# Patient Record
Sex: Male | Born: 1960 | Race: Black or African American | Hispanic: No | Marital: Single | Smoking: Current every day smoker
Health system: Southern US, Community
[De-identification: ages and names within clinical notes are randomized; demographics above are authoritative.]

## PROBLEM LIST (undated history)

## (undated) DIAGNOSIS — T7840XA Allergy, unspecified, initial encounter: Secondary | ICD-10-CM

## (undated) DIAGNOSIS — I10 Essential (primary) hypertension: Secondary | ICD-10-CM

## (undated) HISTORY — PX: TONSILLECTOMY: SHX5217

## (undated) HISTORY — PX: NOSE SURGERY: SHX723

## (undated) HISTORY — DX: Essential (primary) hypertension: I10

## (undated) HISTORY — DX: Allergy, unspecified, initial encounter: T78.40XA

---

## 1998-03-19 ENCOUNTER — Emergency Department (HOSPITAL_COMMUNITY): Admission: EM | Admit: 1998-03-19 | Discharge: 1998-03-19 | Payer: Self-pay | Admitting: *Deleted

## 2002-01-06 ENCOUNTER — Encounter: Payer: Self-pay | Admitting: Emergency Medicine

## 2002-01-06 ENCOUNTER — Emergency Department (HOSPITAL_COMMUNITY): Admission: EM | Admit: 2002-01-06 | Discharge: 2002-01-06 | Payer: Self-pay

## 2005-05-02 ENCOUNTER — Ambulatory Visit (HOSPITAL_COMMUNITY): Admission: RE | Admit: 2005-05-02 | Discharge: 2005-05-02 | Payer: Self-pay | Admitting: Gastroenterology

## 2005-05-02 ENCOUNTER — Encounter (INDEPENDENT_AMBULATORY_CARE_PROVIDER_SITE_OTHER): Payer: Self-pay | Admitting: *Deleted

## 2008-04-15 ENCOUNTER — Emergency Department (HOSPITAL_COMMUNITY): Admission: EM | Admit: 2008-04-15 | Discharge: 2008-04-16 | Payer: Self-pay | Admitting: Emergency Medicine

## 2010-01-23 ENCOUNTER — Emergency Department (HOSPITAL_COMMUNITY): Admission: EM | Admit: 2010-01-23 | Discharge: 2010-01-23 | Payer: Self-pay | Admitting: Family Medicine

## 2010-02-01 ENCOUNTER — Emergency Department (HOSPITAL_COMMUNITY): Admission: EM | Admit: 2010-02-01 | Discharge: 2010-02-01 | Payer: Self-pay | Admitting: Emergency Medicine

## 2010-02-04 ENCOUNTER — Encounter: Admission: RE | Admit: 2010-02-04 | Discharge: 2010-02-04 | Payer: Self-pay | Admitting: Internal Medicine

## 2010-12-13 LAB — URINE CULTURE
Colony Count: NO GROWTH
Culture: NO GROWTH

## 2010-12-13 LAB — POCT I-STAT, CHEM 8
BUN: 8 mg/dL (ref 6–23)
Calcium, Ion: 1.15 mmol/L (ref 1.12–1.32)
Chloride: 102 mEq/L (ref 96–112)
Creatinine, Ser: 1 mg/dL (ref 0.4–1.5)
Glucose, Bld: 123 mg/dL — ABNORMAL HIGH (ref 70–99)
HCT: 49 % (ref 39.0–52.0)
Hemoglobin: 16.7 g/dL (ref 13.0–17.0)
Potassium: 4 mEq/L (ref 3.5–5.1)
Sodium: 139 mEq/L (ref 135–145)
TCO2: 29 mmol/L (ref 0–100)

## 2010-12-13 LAB — GC/CHLAMYDIA PROBE AMP, GENITAL
Chlamydia, DNA Probe: NEGATIVE
GC Probe Amp, Genital: NEGATIVE

## 2010-12-13 LAB — POCT URINALYSIS DIP (DEVICE)
Bilirubin Urine: NEGATIVE
Glucose, UA: NEGATIVE mg/dL
Ketones, ur: NEGATIVE mg/dL
Nitrite: NEGATIVE
Protein, ur: NEGATIVE mg/dL
Specific Gravity, Urine: 1.01 (ref 1.005–1.030)
Urobilinogen, UA: 0.2 mg/dL (ref 0.0–1.0)
pH: 5.5 (ref 5.0–8.0)

## 2011-10-28 IMAGING — CR DG ABDOMEN 1V
1 series · 1 of 1 positions shown · non-contrast
Comparison: None.

CLINICAL DATA: Penile pain/hematuria

ABDOMEN - 1 VIEW

[t abdomen supine]
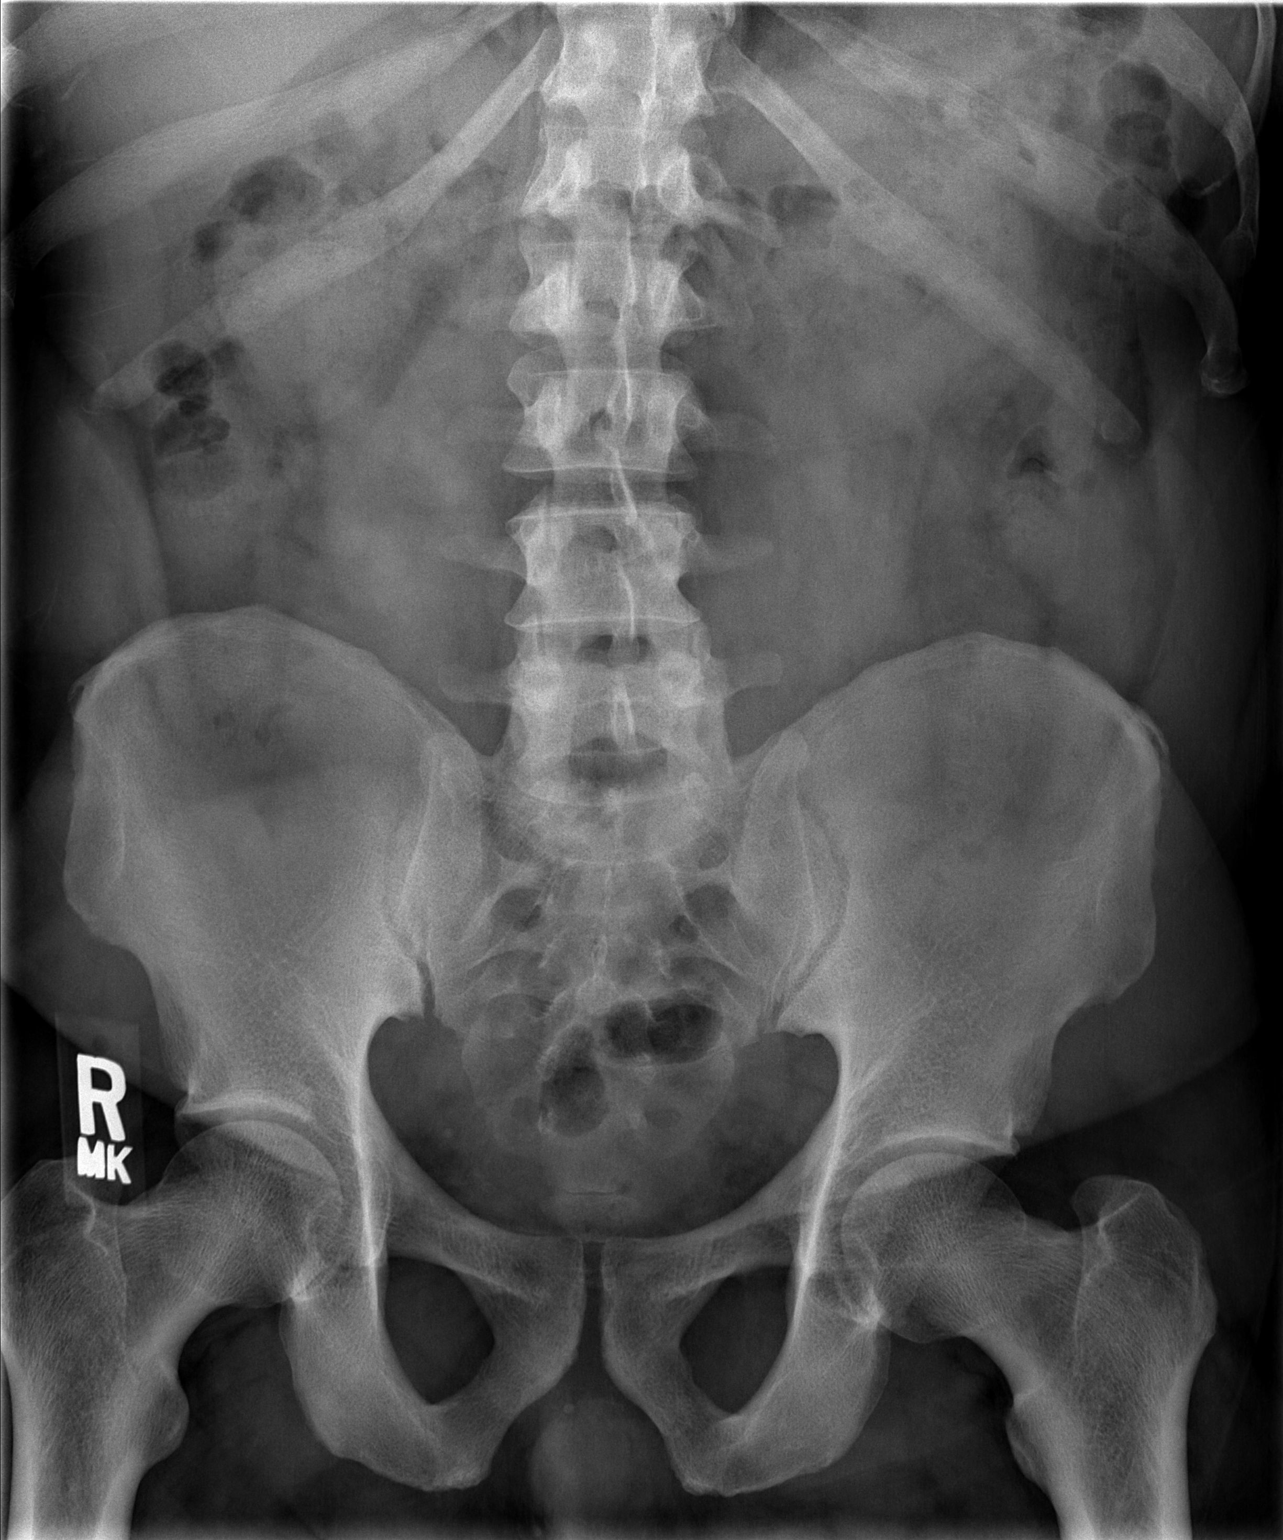

[1 of 1 positions shown; findings below may reference images not displayed]

FINDINGS: There are no visible renal or ureteral calculi.  Psoas margins
intact.  No pathological calcifications.  Osseous structures
intact.
IMPRESSION: No urinary tract calcifications or other pathological findings by
plain film analysis.i

## 2014-07-08 ENCOUNTER — Telehealth: Payer: Self-pay

## 2014-07-08 NOTE — Telephone Encounter (Signed)
Rec'd from [facility] forward 13 pages to Historical Provider

## 2014-07-15 ENCOUNTER — Encounter: Payer: Self-pay | Admitting: Gastroenterology

## 2014-08-04 ENCOUNTER — Ambulatory Visit (AMBULATORY_SURGERY_CENTER): Payer: BC Managed Care – PPO | Admitting: *Deleted

## 2014-08-04 VITALS — Ht 64.0 in | Wt 226.0 lb

## 2014-08-04 DIAGNOSIS — Z86010 Personal history of colonic polyps: Secondary | ICD-10-CM

## 2014-08-04 MED ORDER — NA SULFATE-K SULFATE-MG SULF 17.5-3.13-1.6 GM/177ML PO SOLN: 1.0000 | Freq: Once | ORAL | Status: DC

## 2014-08-04 NOTE — Progress Notes (Signed)
No egg or soy allergy. No anesthesia problems.  No home O2.  No diet meds.  

## 2014-08-10 ENCOUNTER — Encounter: Payer: Self-pay | Admitting: Internal Medicine

## 2014-09-01 ENCOUNTER — Encounter: Payer: Self-pay | Admitting: Gastroenterology

## 2014-09-01 ENCOUNTER — Ambulatory Visit (AMBULATORY_SURGERY_CENTER): Payer: BC Managed Care – PPO | Admitting: Gastroenterology

## 2014-09-01 VITALS — BP 140/89 | HR 82 | Temp 97.9°F | Resp 18 | Ht 64.0 in | Wt 226.0 lb

## 2014-09-01 DIAGNOSIS — K5730 Diverticulosis of large intestine without perforation or abscess without bleeding: Secondary | ICD-10-CM

## 2014-09-01 DIAGNOSIS — D122 Benign neoplasm of ascending colon: Secondary | ICD-10-CM

## 2014-09-01 DIAGNOSIS — K648 Other hemorrhoids: Secondary | ICD-10-CM

## 2014-09-01 DIAGNOSIS — Z86010 Personal history of colonic polyps: Secondary | ICD-10-CM

## 2014-09-01 MED ORDER — SODIUM CHLORIDE 0.9 % IV SOLN: 500.0000 mL | INTRAVENOUS | Status: DC

## 2014-09-01 NOTE — Progress Notes (Signed)
Called to room to assist during endoscopic procedure.  Patient ID and intended procedure confirmed with present staff. Received instructions for my participation in the procedure from the performing physician.  

## 2014-09-01 NOTE — Progress Notes (Signed)
A/ox3 pleased with MAC, report to Celia RN 

## 2014-09-01 NOTE — Op Note (Signed)
[facility]  [address] [place], [address]   COLONOSCOPY PROCEDURE REPORT  PATIENT: Bobby Glass, Bobby Glass  MR#: [id number] BIRTHDATE: 03/25/1961 , 56  yrs. old GENDER: male ENDOSCOPIST: Inda Castle, MD REFERRED BY: PROCEDURE DATE:  09/01/2014 PROCEDURE:   Colonoscopy with snare polypectomy First Screening Colonoscopy - Avg.  risk and is 50 yrs.  old or older - No.  Prior Negative Screening - Now for repeat screening. 10 or more years since last screening  History of Adenoma - Now for follow-up colonoscopy & has been > or = to 3 yrs.  N/A  Polyps Removed Today? Yes. ASA CLASS:   Class II INDICATIONS:average risk for colon cancer. MEDICATIONS: Monitored anesthesia care and Propofol 300 mg IV  DESCRIPTION OF PROCEDURE:   After the risks benefits and alternatives of the procedure were thoroughly explained, informed consent was obtained.  The digital rectal exam revealed no abnormalities of the rectum.   The LB [id number]  endoscope was introduced through the anus and advanced to the cecum, which was identified by both the appendix and ileocecal valve. No adverse events experienced.   .  There was significant respiratory motion throughout the exam somewhat limiting detailed examination of the mucosa.   The quality of the prep was Suprep good  The instrument was then slowly withdrawn as the colon was fully examined.      COLON FINDINGS: A sessile polyp measuring 6 mm in size was found in the ascending colon.  A polypectomy was performed with a cold snare.  The resection was complete, the polyp tissue was completely retrieved and sent to histology.   There was moderate diverticulosis noted in the sigmoid colon and descending colon. Internal hemorrhoids were found.  Retroflexed views revealed no abnormalities. The time to cecum=2 minutes 13 seconds.  Withdrawal time=14 minutes 44 seconds.  The scope was withdrawn and the procedure completed. COMPLICATIONS:  There were no immediate complications.  ENDOSCOPIC IMPRESSION: 1.   Sessile polyp was found in the ascending colon; polypectomy was performed with a cold snare 2.   Moderate diverticulosis was noted in the sigmoid colon and descending colon 3.   Internal hemorrhoids  RECOMMENDATIONS: If the polyp(s) removed today are proven to be adenomatous (pre-cancerous) polyps, you will need a repeat colonoscopy in 5 years.  Otherwise you should continue to follow colorectal cancer screening guidelines for "routine risk" patients with colonoscopy in 10 years.  You will receive a letter within 1-2 weeks with the results of your biopsy as well as final recommendations.  Please call my office if you have not received a letter after 3 weeks.  eSigned:  Inda Castle, MD 09/01/2014 10:10 AM   cc:   PATIENT NAME:  Adarryl, Goldammer MR#: [id number]

## 2014-09-01 NOTE — Patient Instructions (Signed)
Discharge instructions given. Handouts on polyps,diverticulosis and hemorrhoids. Literature on sleep apnea. Resume previous medications. YOU HAD AN ENDOSCOPIC PROCEDURE TODAY AT [facility]: Refer to the procedure report that was given to you for any specific questions about what was found during the examination.  If the procedure report does not answer your questions, please call your gastroenterologist to clarify.  If you requested that your care partner not be given the details of your procedure findings, then the procedure report has been included in a sealed envelope for you to review at your convenience later.  YOU SHOULD EXPECT: Some feelings of bloating in the abdomen. Passage of more gas than usual.  Walking can help get rid of the air that was put into your GI tract during the procedure and reduce the bloating. If you had a lower endoscopy (such as a colonoscopy or flexible sigmoidoscopy) you may notice spotting of blood in your stool or on the toilet paper. If you underwent a bowel prep for your procedure, then you may not have a normal bowel movement for a few days.  DIET: Your first meal following the procedure should be a light meal and then it is ok to progress to your normal diet.  A half-sandwich or bowl of soup is an example of a good first meal.  Heavy or fried foods are harder to digest and may make you feel nauseous or bloated.  Likewise meals heavy in dairy and vegetables can cause extra gas to form and this can also increase the bloating.  Drink plenty of fluids but you should avoid alcoholic beverages for 24 hours.  ACTIVITY: Your care partner should take you home directly after the procedure.  You should plan to take it easy, moving slowly for the rest of the day.  You can resume normal activity the day after the procedure however you should NOT DRIVE or use heavy machinery for 24 hours (because of the sedation medicines used during the test).    SYMPTOMS TO  REPORT IMMEDIATELY: A gastroenterologist can be reached at any hour.  During normal business hours, 8:30 AM to 5:00 PM Monday through Friday, call [phone].  After hours and on weekends, please call the GI answering service at [phone] who will take a message and have the physician on call contact you.   Following lower endoscopy (colonoscopy or flexible sigmoidoscopy):  Excessive amounts of blood in the stool  Significant tenderness or worsening of abdominal pains  Swelling of the abdomen that is new, acute  Fever of 100F or higher  FOLLOW UP: If any biopsies were taken you will be contacted by phone or by letter within the next 1-3 weeks.  Call your gastroenterologist if you have not heard about the biopsies in 3 weeks.  Our staff will call the home number listed on your records the next business day following your procedure to check on you and address any questions or concerns that you may have at that time regarding the information given to you following your procedure. This is a courtesy call and so if there is no answer at the home number and we have not heard from you through the emergency physician on call, we will assume that you have returned to your regular daily activities without incident.  SIGNATURES/CONFIDENTIALITY: You and/or your care partner have signed paperwork which will be entered into your electronic medical record.  These signatures attest to the fact that that the information above on your After Visit  Summary has been reviewed and is understood.  Full responsibility of the confidentiality of this discharge information lies with you and/or your care-partner. 

## 2014-09-02 ENCOUNTER — Telehealth: Payer: Self-pay

## 2014-09-02 NOTE — Telephone Encounter (Signed)
Left message on answering machine. 

## 2014-09-07 ENCOUNTER — Encounter: Payer: Self-pay | Admitting: Gastroenterology

## 2015-03-23 ENCOUNTER — Ambulatory Visit (INDEPENDENT_AMBULATORY_CARE_PROVIDER_SITE_OTHER): Payer: 59 | Admitting: Physician Assistant

## 2015-03-23 VITALS — BP 122/72 | HR 97 | Temp 100.1°F | Resp 17 | Ht 64.5 in | Wt 223.0 lb

## 2015-03-23 DIAGNOSIS — R509 Fever, unspecified: Secondary | ICD-10-CM | POA: Diagnosis not present

## 2015-03-23 DIAGNOSIS — J069 Acute upper respiratory infection, unspecified: Secondary | ICD-10-CM

## 2015-03-23 DIAGNOSIS — K921 Melena: Secondary | ICD-10-CM | POA: Diagnosis not present

## 2015-03-23 DIAGNOSIS — K648 Other hemorrhoids: Secondary | ICD-10-CM | POA: Diagnosis not present

## 2015-03-23 LAB — POCT CBC
GRANULOCYTE PERCENT: 77 % (ref 37–80)
HCT, POC: 49.7 % (ref 43.5–53.7)
HEMOGLOBIN: 16.9 g/dL (ref 14.1–18.1)
Lymph, poc: 1.3 (ref 0.6–3.4)
MCH: 30.3 pg (ref 27–31.2)
MCHC: 33.9 g/dL (ref 31.8–35.4)
MCV: 89.4 fL (ref 80–97)
MID (cbc): 0.4 (ref 0–0.9)
MPV: 7.5 fL (ref 0–99.8)
PLATELET COUNT, POC: 229 10*3/uL (ref 142–424)
POC GRANULOCYTE: 5.5 (ref 2–6.9)
POC LYMPH PERCENT: 17.4 %L (ref 10–50)
POC MID %: 5.6 % (ref 0–12)
RBC: 5.56 M/uL (ref 4.69–6.13)
RDW, POC: 14.3 %
WBC: 7.2 10*3/uL (ref 4.6–10.2)

## 2015-03-23 LAB — POC HEMOCCULT BLD/STL (OFFICE/1-CARD/DIAGNOSTIC): Fecal Occult Blood, POC: POSITIVE — AB

## 2015-03-23 MED ORDER — HYDROCORTISONE ACETATE 25 MG RE SUPP: 25.0000 mg | Freq: Two times a day (BID) | RECTAL | Status: AC | PRN

## 2015-03-23 NOTE — Progress Notes (Signed)
[facility] [address] [place] [address] [phone]  Date:  03/23/2015   Name:  Bobby Glass   DOB:  17-Feb-1961   MRN:  [id number]  PCP:  No primary care provider on file.    Chief Complaint: Blood In Stools   History of Present Illness:  This is a 54 y.o. male who is presenting with bloody stool x 1 week. Blood is either mixed in with stool or on the toilet paper after wiping. Blood is not present with every BM, probably half the time. Pt does have a history of internal and external hemorrhoids. States usually with hemorrhoid flares he gets a burning sensation around his rectum - this time no pain. About 8 months ago he had a similar episode to now where had painless rectal bleeding with BMs. Pt has BMs 3-4 times a day - this is normal for him. He has not a change in his BMs. He denies diarrhea or constipation. No urinary symptoms. He denies abdominal pain, n/v, back pain. He had a colonoscopy 08/2014. He had a benign polyp removed at that time. Internal hemorrhoids were seen. He is on a 10 year screening schedule. He has been supplementing with metamucil since his colonoscopy. He states he does not strain on the toilet and only spends about 5 minutes at a time on the toilet.  Pt has a fever of 101.8 upon arrival today. States he has had URI symptoms x 2 days - Cough, scratchy throat and nasal congestion. Checked his temp this morning before going to work at 99.3. Denies otalgia, sore throat, n/v/d, urinary sx, headache. Has tried mucinex and helping.  Review of Systems:  Review of Systems See HPI  There are no active problems to display for this patient.   Prior to Admission medications   Medication Sig Start Date End Date Taking? Authorizing Provider  aspirin 81 MG tablet Take 81 mg by mouth daily.   Yes Historical Provider, MD  guaiFENesin (MUCINEX) 600 MG 12 hr tablet Take by mouth 2 (two) times daily.   Yes Historical Provider, MD  L-ARGININE PO  Take 1 tablet by mouth.   Yes Historical Provider, MD  lisinopril-hydrochlorothiazide (PRINZIDE,ZESTORETIC) 10-12.5 MG per tablet Take 1 tablet by mouth daily.   Yes Historical Provider, MD  loratadine (CLARITIN) 10 MG tablet Take 10 mg by mouth daily.   Yes Historical Provider, MD  Omega-3 Fatty Acids (FISH OIL CONCENTRATE PO) Take by mouth.   Yes Historical Provider, MD  Psyllium (METAMUCIL PO) Take by mouth.   Yes Historical Provider, MD    No Known Allergies  Past Surgical History  Procedure Laterality Date  . Tonsillectomy    . Nose surgery      History  Substance Use Topics  . Smoking status: Current Every Day Smoker -- 1.00 packs/day for 27 years    Types: Cigarettes  . Smokeless tobacco: Never Used  . Alcohol Use: 0.0 oz/week    0 Standard drinks or equivalent per week     Comment: occasionally    Family History  Problem Relation Age of Onset  . Colon cancer Neg Hx   . Diabetes Mother   . Diabetes Father   . Prostate cancer Father     Medication list has been reviewed and updated.  Physical Examination:  Physical Exam  Constitutional: He is oriented to person, place, and time. He appears well-developed and well-nourished. No distress.  HENT:  Head: Normocephalic and atraumatic.  Right Ear: Hearing, tympanic membrane, external ear and ear canal normal.  Left Ear: Hearing, tympanic membrane, external ear and ear canal normal.  Nose: Mucosal edema present. Right sinus exhibits no maxillary sinus tenderness and no frontal sinus tenderness. Left sinus exhibits no maxillary sinus tenderness and no frontal sinus tenderness.  Mouth/Throat: Uvula is midline and mucous membranes are normal. Posterior oropharyngeal erythema present. No oropharyngeal exudate or posterior oropharyngeal edema.  Eyes: Conjunctivae and lids are normal. Right eye exhibits no discharge. Left eye exhibits no discharge. No scleral icterus.  Cardiovascular: Normal rate, regular rhythm, normal heart  sounds and normal pulses.   No murmur heard. Pulmonary/Chest: Effort normal and breath sounds normal. No respiratory distress. He has no wheezes. He has no rhonchi. He has no rales.  Abdominal: Soft. Normal appearance. There is no tenderness. There is no CVA tenderness.  Genitourinary: Rectal exam shows internal hemorrhoid (7 o'clock position). Rectal exam shows no external hemorrhoid, no mass, no tenderness and anal tone normal. Guaiac positive stool.  Musculoskeletal: Normal range of motion.  Lymphadenopathy:       Head (right side): No submental, no submandibular and no tonsillar adenopathy present.       Head (left side): No submental, no submandibular and no tonsillar adenopathy present.    He has no cervical adenopathy.  Neurological: He is alert and oriented to person, place, and time.  Skin: Skin is warm, dry and intact. No lesion and no rash noted.  Psychiatric: He has a normal mood and affect. His speech is normal and behavior is normal. Thought content normal.   BP 122/72 mmHg  Pulse 97  Temp(Src) 101.8 F (38.8 C) (Oral)  Resp 17  Ht 5' 4.5" (1.638 m)  Wt 223 lb (101.152 kg)  BMI 37.70 kg/m2  SpO2 97%   Fever reduced to 100.1 on recheck, 30 minutes after given 1 gm tylenol  Results for orders placed or performed in visit on 03/23/15  POCT CBC  Result Value Ref Range   WBC 7.2 4.6 - 10.2 K/uL   Lymph, poc 1.3 0.6 - 3.4   POC LYMPH PERCENT 17.4 10 - 50 %L   MID (cbc) 0.4 0 - 0.9   POC MID % 5.6 0 - 12 %M   POC Granulocyte 5.5 2 - 6.9   Granulocyte percent 77.0 37 - 80 %G   RBC 5.56 4.69 - 6.13 M/uL   Hemoglobin 16.9 14.1 - 18.1 g/dL   HCT, POC 49.7 43.5 - 53.7 %   MCV 89.4 80 - 97 fL   MCH, POC 30.3 27 - 31.2 pg   MCHC 33.9 31.8 - 35.4 g/dL   RDW, POC 14.3 %   Platelet Count, POC 229 142 - 424 K/uL   MPV 7.5 0 - 99.8 fL  POC Hemoccult Bld/Stl (1-Cd Office Dx)  Result Value Ref Range   Card #1 Date 03/23/2015    Fecal Occult Blood, POC Positive (A) Negative     Assessment and Plan:  1. Internal hemorrhoids 2. Bloody stool Hemoccult positive. No anemia on CBC. Bloody stools likely from internal hemorrhoids which he has a hx of and were seen on colonoscopy 08/2014. Colonoscopy with removal of benign polyp but otherwise unremarkable. Pt does have a fever but thought to be d/t viral URI. No change in stool other than blood. Prescribed anusol suppositories to use twice a day for next 2-3 days and then prn irritation/bloody stool. Continue daily metamucil supplementation. Return if not getting better in 2  weeks. - hydrocortisone (ANUSOL-HC) 25 MG suppository; Place 1 suppository (25 mg total) rectally 2 (two) times daily as needed for hemorrhoids or itching.  Dispense: 12 suppository; Refill: 0 - POCT CBC - POC Hemoccult Bld/Stl (1-Cd Office Dx)  3. Fever, unspecified fever cause 4. Viral URI CBC wnl. Likely viral URI as cause. Fever reduced from 101.8 to 100.1 with 1 gm tylenol. Pt did not want anything prescribed for illness. He will continue home mucinex, hydration and rest. Return if not getting better in 4-5 days. - POCT CBC   Benjaman Pott. Drenda Freeze, MHS [facility]  03/26/2015

## 2015-03-23 NOTE — Patient Instructions (Signed)
Use anusol suppositories twice a day as needed for rectal bleeding. Continue with daily metamucil. Don't strain on the toilet. If you continue to have problems let me know - we can always refer you to GI. Take mucinex and drinking plenty of water 64 oz/day. Get plenty of rest!!!!

## 2019-08-25 ENCOUNTER — Encounter: Payer: Self-pay | Admitting: Gastroenterology

## 2019-10-01 ENCOUNTER — Ambulatory Visit: Payer: 59 | Attending: Internal Medicine

## 2019-10-01 DIAGNOSIS — Z20822 Contact with and (suspected) exposure to covid-19: Secondary | ICD-10-CM

## 2019-10-02 LAB — NOVEL CORONAVIRUS, NAA: SARS-CoV-2, NAA: NOT DETECTED

## 2020-07-27 ENCOUNTER — Ambulatory Visit: Payer: 59 | Attending: Internal Medicine

## 2020-07-27 DIAGNOSIS — Z23 Encounter for immunization: Secondary | ICD-10-CM

## 2020-07-27 NOTE — Progress Notes (Signed)
   Covid-[facility] Vaccination [facility]  Name:  Bobby Glass    MRN: [id number] DOB: 1960/10/03  07/27/2020  Mr. Bobby Glass was observed post Covid-19 immunization for 15 minutes without incident. He was provided with Vaccine Information Sheet and instruction to access the V-Safe system.   Mr. Bobby Glass was instructed to call 911 with any severe reactions post vaccine: Marland Kitchen Difficulty breathing  . Swelling of face and throat  . A fast heartbeat  . A bad rash all over body  . Dizziness and weakness
